# Patient Record
Sex: Female | Born: 1980 | Race: White | Hispanic: No | Marital: Married | Smoking: Current every day smoker
Health system: Southern US, Community
[De-identification: ages and names within clinical notes are randomized; demographics above are authoritative.]

## PROBLEM LIST (undated history)

## (undated) DIAGNOSIS — F419 Anxiety disorder, unspecified: Secondary | ICD-10-CM

## (undated) HISTORY — DX: Anxiety disorder, unspecified: F41.9

## (undated) HISTORY — PX: OTHER SURGICAL HISTORY: SHX169

---

## 2018-10-28 ENCOUNTER — Encounter: Payer: Self-pay | Admitting: Obstetrics and Gynecology

## 2018-10-28 ENCOUNTER — Ambulatory Visit: Payer: BC Managed Care – PPO | Admitting: Obstetrics and Gynecology

## 2018-10-28 ENCOUNTER — Other Ambulatory Visit: Payer: Self-pay

## 2018-10-28 VITALS — BP 112/78 | HR 105 | Ht <= 58 in | Wt 93.0 lb

## 2018-10-28 DIAGNOSIS — N926 Irregular menstruation, unspecified: Secondary | ICD-10-CM

## 2018-10-28 NOTE — Progress Notes (Signed)
Obstetrics & Gynecology Office Visit   Chief Complaint  Patient presents with  . Menstrual Problem    no warning of cycle coming   History of Present Illness: 38 y.o. 32P0020 female who presents for menstrual issues. Last Friday (9/4) she had her last menstrual cycle.  She states that she rarely has any premenstrual symptoms.  Normally, she has her menses at the beginning of the month, at least lately.  Her periods normally last about 7 days.  Her periods occur about every 4-5 weeks.  She uses nothing for contraception.  She and her husband were trying a few years ago. However, her husband was diagnosed with ankylosing spondylitis about 18 months ago.  Last pap smear: 05/2015, NILM, HPV negative.  This is the only pap smear she has ever had. [facility]/2017 was normal.  [facility] last [facility].   Past Medical History:  Diagnosis Date  . Anxiety     Past Surgical History:  Procedure Laterality Date  . ganglion cyst removal    . teeth removal     7 removed at one time    Gynecologic History: 10/23/2018  Obstetric History: G2P0020  Family History  Problem Relation Age of Onset  . Breast cancer Maternal Aunt 50  . Colon cancer Cousin 20    Social History   Socioeconomic History  . Marital status: Married    Spouse name: Not on file  . Number of children: Not on file  . Years of education: Not on file  . Highest education level: Not on file  Occupational History  . Not on file  Social Needs  . Financial resource strain: Not on file  . Food insecurity    Worry: Not on file    Inability: Not on file  . Transportation needs    Medical: Not on file    Non-medical: Not on file  Tobacco Use  . Smoking status: Current Every Day Smoker    Types: Cigarettes  . Smokeless tobacco: Never Used  Substance and Sexual Activity  . Alcohol use: Not Currently  . Drug use: Not Currently  . Sexual activity: Yes    Partners: Male    Birth  control/protection: None  Lifestyle  . Physical activity    Days per week: Not on file    Minutes per session: Not on file  . Stress: Not on file  Relationships  . Social Musicianconnections    Talks on phone: Not on file    Gets together: Not on file    Attends religious service: Not on file    Active member of club or organization: Not on file    Attends meetings of clubs or organizations: Not on file    Relationship status: Not on file  . Intimate partner violence    Fear of current or ex partner: Not on file    Emotionally abused: Not on file    Physically abused: Not on file    Forced sexual activity: Not on file  Other Topics Concern  . Not on file  Social History Narrative  . Not on file    Allergies  Allergen Reactions  . Cyclobenzaprine Palpitations    Prior to Admission medications   Medication Sig Start Date End Date Taking? Authorizing Provider  sertraline (ZOLOFT) 25 MG tablet Take 25 mg by mouth at bedtime. 06/23/18  Yes [provider]    Review of Systems  Constitutional: Negative.   HENT: Negative.  Eyes: Negative.   Respiratory: Negative.   Cardiovascular: Negative.   Gastrointestinal: Negative.   Genitourinary: Negative.   Musculoskeletal: Negative.   Skin: Negative.   Neurological: Negative.   Psychiatric/Behavioral: Negative.      Physical Exam BP 112/78   Pulse (!) 105   Ht 4\' 8"  (1.422 m)   Wt 93 lb (42.2 kg)   BMI 20.85 kg/m  No LMP recorded. Physical Exam Constitutional:      General: She is not in acute distress.    Appearance: Normal appearance.  HENT:     Head: Normocephalic and atraumatic.  Eyes:     General: No scleral icterus.    Conjunctiva/sclera: Conjunctivae normal.  Neurological:     General: No focal deficit present.     Mental Status: She is alert and oriented to person, place, and time.     Cranial Nerves: No cranial nerve deficit.  Psychiatric:        Mood and Affect: Mood normal.        Behavior: Behavior  normal.        Judgment: Judgment normal.     Female chaperone present for pelvic and breast  portions of the physical exam  Assessment: 38 y.o. G27P0010 female here for  1. Irregular menses      Plan: Problem List Items Addressed This Visit    None    Visit Diagnoses    Irregular menses    -  Primary     Discussed normal symptoms of menstruation and pre-menstruation.  Discussed routine gynecologic screening.   20 minutes spent in face to face discussion with > 50% spent in counseling,management, and coordination of care of her irregular menses.   Prentice Docker, MD 10/28/2018 3:26 PM

## 2019-03-29 ENCOUNTER — Other Ambulatory Visit: Payer: Self-pay | Admitting: Family Medicine

## 2019-03-29 ENCOUNTER — Ambulatory Visit
Admission: RE | Admit: 2019-03-29 | Discharge: 2019-03-29 | Disposition: A | Discharge status: Home / Self Care | Payer: BC Managed Care – PPO | Attending: Family Medicine | Admitting: Family Medicine

## 2019-03-29 ENCOUNTER — Ambulatory Visit
Admission: RE | Admit: 2019-03-29 | Discharge: 2019-03-29 | Disposition: A | Discharge status: Home / Self Care | Payer: BC Managed Care – PPO | Source: Ambulatory Visit | Attending: Family Medicine | Admitting: Family Medicine

## 2019-03-29 ENCOUNTER — Other Ambulatory Visit: Payer: Self-pay

## 2019-03-29 DIAGNOSIS — J4521 Mild intermittent asthma with (acute) exacerbation: Secondary | ICD-10-CM | POA: Diagnosis present

## 2019-03-29 DIAGNOSIS — R059 Cough, unspecified: Secondary | ICD-10-CM

## 2019-03-29 DIAGNOSIS — R05 Cough: Secondary | ICD-10-CM | POA: Diagnosis present

## 2021-07-17 IMAGING — CR DG CHEST 2V
2 series · 2 of 2 positions shown · non-contrast
Comparison: None.

CLINICAL DATA: Intermittent asthma exacerbation, coughing after
smoking marijuana at night

EXAM:
CHEST - 2 VIEW

[chest pa]
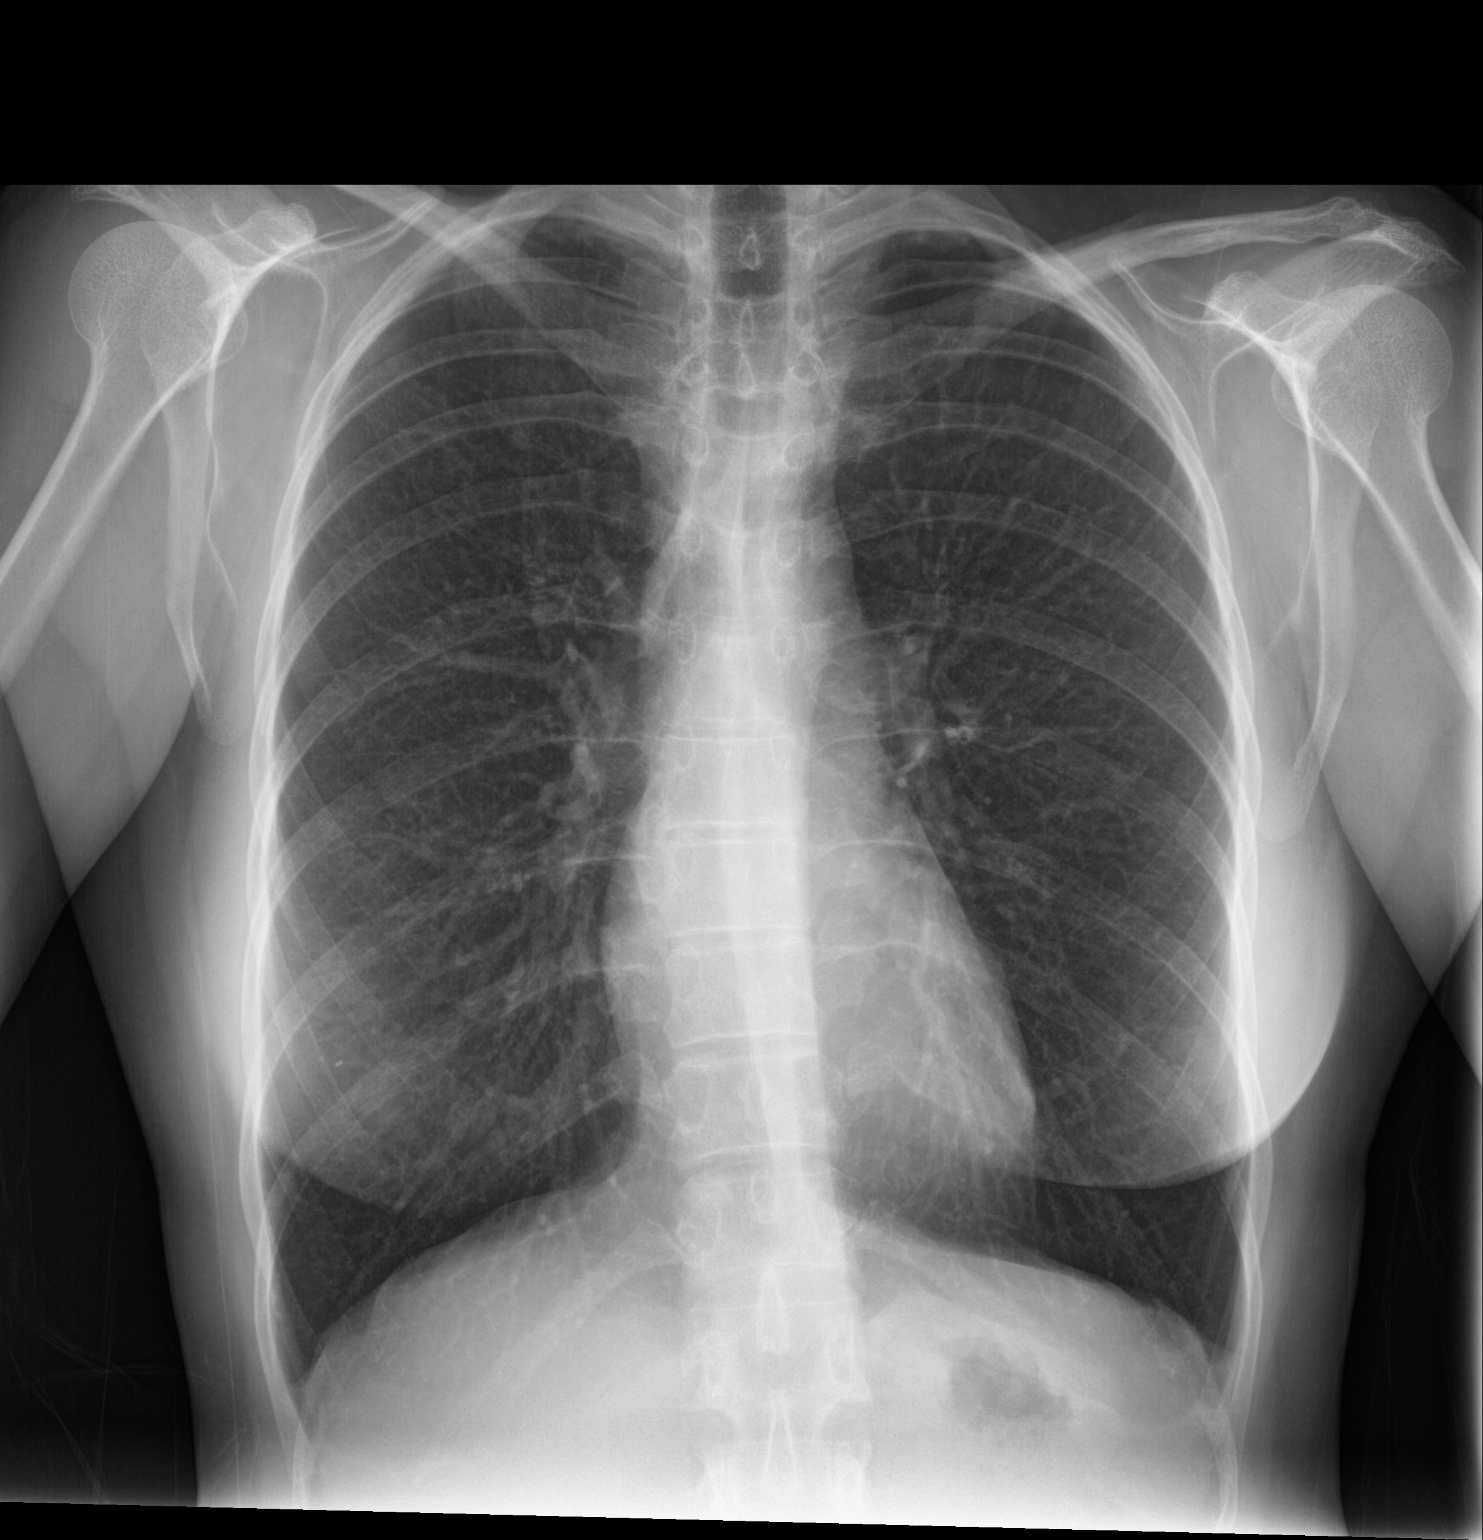

[chest lat]
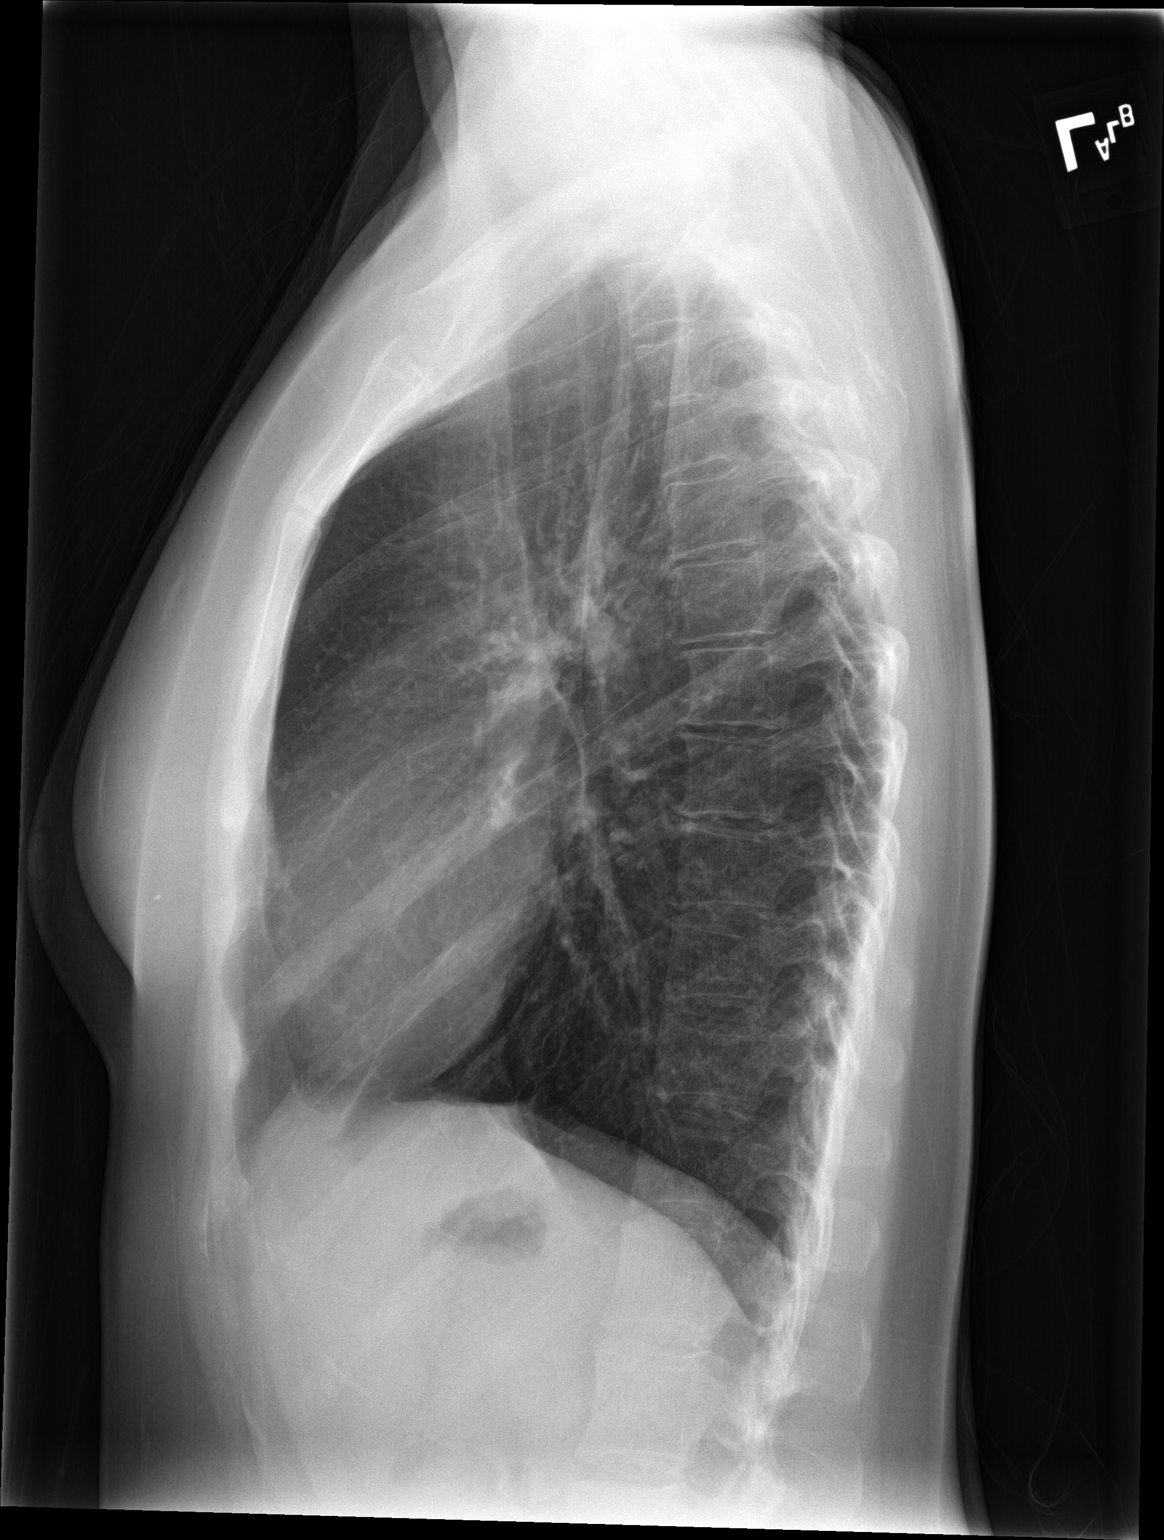

[2 of 2 positions shown; findings below may reference images not displayed]

FINDINGS: Hyperinflation of the lungs with some basilar airways thickening
compatible with history of reactive airways disease. No
consolidation, features of edema, pneumothorax, or effusion. The
cardiomediastinal contours are unremarkable. No acute osseous or
soft tissue abnormality.
IMPRESSION: Hyperinflation and basilar airways thickening compatible with
history of reactive airways disease.

No other acute cardiopulmonary abnormality.
